# Patient Record
Sex: Female | Born: 1935 | Race: White | Hispanic: No | Marital: Married | Smoking: Former smoker
Health system: Southern US, Community
[De-identification: ages and names within clinical notes are randomized; demographics above are authoritative.]

## PROBLEM LIST (undated history)

## (undated) DIAGNOSIS — K5900 Constipation, unspecified: Secondary | ICD-10-CM

## (undated) DIAGNOSIS — N390 Urinary tract infection, site not specified: Secondary | ICD-10-CM

## (undated) DIAGNOSIS — M1990 Unspecified osteoarthritis, unspecified site: Secondary | ICD-10-CM

## (undated) DIAGNOSIS — F0390 Unspecified dementia without behavioral disturbance: Secondary | ICD-10-CM

## (undated) DIAGNOSIS — I10 Essential (primary) hypertension: Secondary | ICD-10-CM

## (undated) DIAGNOSIS — M4800 Spinal stenosis, site unspecified: Secondary | ICD-10-CM

## (undated) DIAGNOSIS — F419 Anxiety disorder, unspecified: Secondary | ICD-10-CM

## (undated) DIAGNOSIS — F32A Depression, unspecified: Secondary | ICD-10-CM

## (undated) DIAGNOSIS — F329 Major depressive disorder, single episode, unspecified: Secondary | ICD-10-CM

## (undated) DIAGNOSIS — R011 Cardiac murmur, unspecified: Secondary | ICD-10-CM

---

## 2016-08-17 ENCOUNTER — Emergency Department (HOSPITAL_COMMUNITY): Payer: Medicare Other

## 2016-08-17 ENCOUNTER — Encounter (HOSPITAL_COMMUNITY): Payer: Self-pay | Admitting: *Deleted

## 2016-08-17 ENCOUNTER — Observation Stay (HOSPITAL_COMMUNITY)
Admission: EM | Admit: 2016-08-17 | Discharge: 2016-08-18 | Disposition: A | Discharge status: Home / Self Care | Payer: Medicare Other | Attending: Internal Medicine | Admitting: Internal Medicine

## 2016-08-17 DIAGNOSIS — R4182 Altered mental status, unspecified: Secondary | ICD-10-CM | POA: Diagnosis present

## 2016-08-17 DIAGNOSIS — R011 Cardiac murmur, unspecified: Secondary | ICD-10-CM | POA: Diagnosis not present

## 2016-08-17 DIAGNOSIS — Z5181 Encounter for therapeutic drug level monitoring: Secondary | ICD-10-CM | POA: Insufficient documentation

## 2016-08-17 DIAGNOSIS — M6281 Muscle weakness (generalized): Secondary | ICD-10-CM | POA: Diagnosis not present

## 2016-08-17 DIAGNOSIS — Z79899 Other long term (current) drug therapy: Secondary | ICD-10-CM | POA: Insufficient documentation

## 2016-08-17 DIAGNOSIS — R2689 Other abnormalities of gait and mobility: Secondary | ICD-10-CM | POA: Diagnosis not present

## 2016-08-17 DIAGNOSIS — F0390 Unspecified dementia without behavioral disturbance: Secondary | ICD-10-CM | POA: Insufficient documentation

## 2016-08-17 DIAGNOSIS — R404 Transient alteration of awareness: Principal | ICD-10-CM | POA: Insufficient documentation

## 2016-08-17 DIAGNOSIS — Z9181 History of falling: Secondary | ICD-10-CM | POA: Diagnosis not present

## 2016-08-17 DIAGNOSIS — G459 Transient cerebral ischemic attack, unspecified: Secondary | ICD-10-CM | POA: Diagnosis present

## 2016-08-17 HISTORY — DX: Anxiety disorder, unspecified: F41.9

## 2016-08-17 HISTORY — DX: Unspecified osteoarthritis, unspecified site: M19.90

## 2016-08-17 HISTORY — DX: Cardiac murmur, unspecified: R01.1

## 2016-08-17 HISTORY — DX: Constipation, unspecified: K59.00

## 2016-08-17 HISTORY — DX: Depression, unspecified: F32.A

## 2016-08-17 HISTORY — DX: Urinary tract infection, site not specified: N39.0

## 2016-08-17 HISTORY — DX: Major depressive disorder, single episode, unspecified: F32.9

## 2016-08-17 HISTORY — DX: Essential (primary) hypertension: I10

## 2016-08-17 HISTORY — DX: Spinal stenosis, site unspecified: M48.00

## 2016-08-17 HISTORY — DX: Unspecified dementia, unspecified severity, without behavioral disturbance, psychotic disturbance, mood disturbance, and anxiety: F03.90

## 2016-08-17 LAB — CBC WITH DIFFERENTIAL/PLATELET
BASOS ABS: 0.1 10*3/uL (ref 0.0–0.1)
Basophils Relative: 1 %
EOS ABS: 0.1 10*3/uL (ref 0.0–0.7)
EOS PCT: 2 %
HCT: 36 % (ref 36.0–46.0)
Hemoglobin: 11.6 g/dL — ABNORMAL LOW (ref 12.0–15.0)
Lymphocytes Relative: 27 %
Lymphs Abs: 1.6 10*3/uL (ref 0.7–4.0)
MCH: 30.9 pg (ref 26.0–34.0)
MCHC: 32.2 g/dL (ref 30.0–36.0)
MCV: 96 fL (ref 78.0–100.0)
Monocytes Absolute: 0.6 10*3/uL (ref 0.1–1.0)
Monocytes Relative: 9 %
NEUTROS PCT: 61 %
Neutro Abs: 3.8 10*3/uL (ref 1.7–7.7)
PLATELETS: 163 10*3/uL (ref 150–400)
RBC: 3.75 MIL/uL — AB (ref 3.87–5.11)
RDW: 14.2 % (ref 11.5–15.5)
WBC: 6.2 10*3/uL (ref 4.0–10.5)

## 2016-08-17 LAB — PROTIME-INR
INR: 1.25
PROTHROMBIN TIME: 15.8 s — AB (ref 11.4–15.2)

## 2016-08-17 LAB — COMPREHENSIVE METABOLIC PANEL
ALT: 12 U/L — ABNORMAL LOW (ref 14–54)
AST: 22 U/L (ref 15–41)
Albumin: 3.7 g/dL (ref 3.5–5.0)
Alkaline Phosphatase: 62 U/L (ref 38–126)
Anion gap: 7 (ref 5–15)
BUN: 18 mg/dL (ref 6–20)
CO2: 32 mmol/L (ref 22–32)
CREATININE: 1.06 mg/dL — AB (ref 0.44–1.00)
Calcium: 9.3 mg/dL (ref 8.9–10.3)
Chloride: 102 mmol/L (ref 101–111)
GFR, EST AFRICAN AMERICAN: 54 mL/min — AB (ref >60)
GFR, EST NON AFRICAN AMERICAN: 47 mL/min — AB (ref >60)
Glucose, Bld: 96 mg/dL (ref 65–99)
Potassium: 4.1 mmol/L (ref 3.5–5.1)
Sodium: 141 mmol/L (ref 135–145)
TOTAL PROTEIN: 6.5 g/dL (ref 6.5–8.1)
Total Bilirubin: 0.5 mg/dL (ref 0.3–1.2)

## 2016-08-17 LAB — RAPID URINE DRUG SCREEN, HOSP PERFORMED
Amphetamines: NOT DETECTED
Barbiturates: NOT DETECTED
Benzodiazepines: POSITIVE — AB
Cocaine: NOT DETECTED
Opiates: NOT DETECTED
Tetrahydrocannabinol: NOT DETECTED

## 2016-08-17 LAB — URINALYSIS, ROUTINE W REFLEX MICROSCOPIC
BILIRUBIN URINE: NEGATIVE
Glucose, UA: NEGATIVE mg/dL
Hgb urine dipstick: NEGATIVE
Ketones, ur: NEGATIVE mg/dL
LEUKOCYTES UA: NEGATIVE
NITRITE: NEGATIVE
Protein, ur: NEGATIVE mg/dL
SPECIFIC GRAVITY, URINE: 1.013 (ref 1.005–1.030)
pH: 6 (ref 5.0–8.0)

## 2016-08-17 LAB — APTT: aPTT: 31 seconds (ref 24–36)

## 2016-08-17 LAB — TROPONIN I

## 2016-08-17 LAB — ETHANOL

## 2016-08-17 MED ORDER — SODIUM CHLORIDE 0.9 % IV BOLUS (SEPSIS)
500.0000 mL | Freq: Once | INTRAVENOUS | Status: AC
  Administered 2016-08-17: 500 mL via INTRAVENOUS

## 2016-08-17 MED ORDER — SODIUM CHLORIDE 0.9 % IV SOLN
100.0000 mL/h | INTRAVENOUS | Status: DC
  Administered 2016-08-17: 100 mL/h via INTRAVENOUS

## 2016-08-17 NOTE — ED Triage Notes (Signed)
Confusion onset 1430 yesterday

## 2016-08-17 NOTE — ED Notes (Signed)
MD at bedside. EKG given to Dr. Jeraldine LootsLockwood.  Pt family states she has been having confusion since yesterday and bilateral leg swelling that was significant yesterday.

## 2016-08-17 NOTE — ED Notes (Signed)
Pt has bilateral 1+ pitting edema to bilateral lower extremities, pulses and sensation intact.

## 2016-08-17 NOTE — ED Notes (Signed)
Pt placed on bedpan

## 2016-08-17 NOTE — ED Provider Notes (Signed)
[facility]Y DEPT Provider Note   CSN: [id number] Arrival date & time: 08/17/16  1609     History   Chief Complaint Chief Complaint  Patient presents with  . Altered Mental Status    HPI Jessica Medina is a 83 y.o. female.  HPI Patient presents with family members to assist with the history of present illness. Patient notes patient has a history of arthritis, and dementia, but states that she is generally awake and alert, with only intermittent episodes of forgetfulness, confusion. Now, since yesterday the patient has had persistent word finding difficulty, diminished recognition of family members, and disorientation. Family states the patient also had substantial bilateral lower extremity swelling yesterday, though this has resolved The patient herself struggles to find words such as ankles, but seemingly denies new pain, new confusion, and answers questions when asked, though with some delay, and occasionally with lapses in accuracy. LEVEL V CAVEAT 2/2 CONFUSION  Past Medical History:  Diagnosis Date  . Arthritis     There are no active problems to display for this patient.   No past surgical history on file.  OB History    No data available       Home Medications    Prior to Admission medications   Not on File    Family History No family history on file.  Social History Social History  Substance Use Topics  . Smoking status: Never Smoker  . Smokeless tobacco: Not on file  . Alcohol use No     Allergies   Aspirin   Review of Systems Review of Systems  Unable to perform ROS: Dementia     Physical Exam Updated Vital Signs BP (!) 145/59 (BP Location: Left Arm)   Pulse 62   Temp 98.4 F (36.9 C) (Oral)   Resp 16   Ht 5\' 3"  (1.6 m)   Wt 49.9 kg (110 lb)   SpO2 97%   BMI 19.49 kg/m   Physical Exam  Constitutional: She appears well-developed and well-nourished. No distress.  HENT:  Head: Normocephalic and atraumatic.  Eyes:  Conjunctivae and EOM are normal.  Cardiovascular: Normal rate and regular rhythm.   Murmur heard. Pulmonary/Chest: Effort normal and breath sounds normal. No stridor. No respiratory distress.  Abdominal: She exhibits no distension.  Musculoskeletal: She exhibits no edema.  Neurological: She is alert. She displays atrophy. She displays no tremor. No cranial nerve deficit or sensory deficit. She exhibits normal muscle tone. She displays no seizure activity. Coordination normal.  Skin: Skin is warm and dry.  Psychiatric: Her speech is delayed and tangential. Cognition and memory are impaired.  Confused  Nursing note and vitals reviewed.    ED Treatments / Results  Labs (all labs ordered are listed, but only abnormal results are displayed) Labs Reviewed  COMPREHENSIVE METABOLIC PANEL - Abnormal; Notable for the following:       Result Value   Creatinine, Ser 1.06 (*)    ALT 12 (*)    GFR calc non Af Amer 47 (*)    GFR calc Af Amer 54 (*)    All other components within normal limits  CBC WITH DIFFERENTIAL/PLATELET - Abnormal; Notable for the following:    RBC 3.75 (*)    Hemoglobin 11.6 (*)    All other components within normal limits  PROTIME-INR - Abnormal; Notable for the following:    Prothrombin Time 15.8 (*)    All other components within normal limits  RAPID URINE DRUG SCREEN, HOSP PERFORMED - Abnormal; Notable  for the following:    Benzodiazepines POSITIVE (*)    All other components within normal limits  APTT  ETHANOL  TROPONIN I  URINALYSIS, ROUTINE W REFLEX MICROSCOPIC    EKG  EKG Interpretation  Date/Time:  Thursday Aug 17 2016 16:31:35 EDT Ventricular Rate:  64 PR Interval:    QRS Duration: 94 QT Interval:  402 QTC Calculation: 415 R Axis:   46 Text Interpretation:  Sinus rhythm low voltage p waves Borderline ECG Confirmed by Gerhard MunchLockwood, Gilma Bessette [phone]) on 08/17/2016 5:04:11 PM       Radiology Ct Head Wo Contrast  Result Date: 08/17/2016 CLINICAL DATA:   Altered mental status since yesterday. EXAM: CT HEAD WITHOUT CONTRAST TECHNIQUE: Contiguous axial images were obtained from the base of the skull through the vertex without intravenous contrast. COMPARISON:  None. FINDINGS: Brain: Diffusely enlarged ventricles and subarachnoid spaces. Patchy white matter low density in both cerebral hemispheres. No intracranial hemorrhage, mass lesion or CT evidence of acute infarction. Vascular: No hyperdense vessel or unexpected calcification. Skull: Normal. Negative for fracture or focal lesion. Sinuses/Orbits: Unremarkable. Other: None. IMPRESSION: 1. No acute abnormality. 2. Atrophy and chronic small vessel white matter ischemic changes. Electronically Signed   By: Beckie SaltsSteven  Reid M.D.   On: 08/17/2016 18:35    Procedures Procedures (including critical care time)  Medications Ordered in ED Medications  sodium chloride 0.9 % bolus 500 mL (500 mLs Intravenous New Bag/Given 08/17/16 1652)    Followed by  0.9 %  sodium chloride infusion (not administered)     Initial Impression / Assessment and Plan / ED Course  I have reviewed the triage vital signs and the nursing notes.  Pertinent labs & imaging results that were available during my care of the patient were reviewed by me and considered in my medical decision making (see chart for details).  8:30 PM Patient back to baseline according to family members. Together we discussed all findings including reassuring CT scan, my suspicion for likelihood of worsening cognitive function, but with some concern for TIA. Another consideration is iatrogenic, polypharmacy, and the patient does take benzodiazepine, and uncontrolled her to confusion in the elderly. With the patient's return to baseline, reassuring results, no indication for additional emergent imaging, but the patient will be admitted for further evaluation and management.  Final Clinical Impressions(s) / ED Diagnoses  Altered mental status   Gerhard MunchLockwood,  Izyan Ezzell, MD 08/17/16 2031

## 2016-08-17 NOTE — ED Notes (Signed)
Bedpan removed from pt, skin cleaned and dried.

## 2016-08-18 ENCOUNTER — Observation Stay (HOSPITAL_COMMUNITY): Payer: Medicare Other

## 2016-08-18 ENCOUNTER — Observation Stay (HOSPITAL_BASED_OUTPATIENT_CLINIC_OR_DEPARTMENT_OTHER): Payer: Medicare Other

## 2016-08-18 DIAGNOSIS — R404 Transient alteration of awareness: Secondary | ICD-10-CM | POA: Diagnosis not present

## 2016-08-18 DIAGNOSIS — F0390 Unspecified dementia without behavioral disturbance: Secondary | ICD-10-CM | POA: Diagnosis not present

## 2016-08-18 DIAGNOSIS — G459 Transient cerebral ischemic attack, unspecified: Secondary | ICD-10-CM

## 2016-08-18 DIAGNOSIS — I361 Nonrheumatic tricuspid (valve) insufficiency: Secondary | ICD-10-CM | POA: Diagnosis not present

## 2016-08-18 LAB — ECHOCARDIOGRAM COMPLETE
AO mean calculated velocity dopler: 389 cm/s
AOPV: 0.27 m/s
AOVTI: 141 cm
AV Area VTI: 0.6 cm2
AV Area mean vel: 0.53 cm2
AV area mean vel ind: 0.35 cm2/m2
AV vel: 0.44
AVA: 0.44 cm2
AVAREAVTIIND: 0.29 cm2/m2
AVCELMEANRAT: 0.23
AVG: 70 mmHg
AVLVOTPG: 9 mmHg
AVPG: 123 mmHg
AVPKVEL: 554 cm/s
CHL CUP AV PEAK INDEX: 0.4
CHL CUP AV VALUE AREA INDEX: 0.29
CHL CUP MV DEC (S): 208
CHL CUP RV SYS PRESS: 43 mmHg
EERAT: 24.29
EWDT: 208 ms
FS: 30 % (ref 28–44)
Height: 63 in
IV/PV OW: 0.97
LA diam end sys: 34 mm
LA vol: 84 mL
LADIAMINDEX: 2.24 cm/m2
LASIZE: 34 mm
LAVOLA4C: 109 mL
LAVOLIN: 55.4 mL/m2
LDCA: 2.27 cm2
LV E/e' medial: 24.29
LV TDI E'MEDIAL: 7.07
LVDIAVOL: 72 mL (ref 46–106)
LVDIAVOLIN: 48 mL/m2
LVEEAVG: 24.29
LVELAT: 8.81 cm/s
LVOT VTI: 27.5 cm
LVOT diameter: 17 mm
LVOT peak VTI: 0.2 cm
LVOT peak vel: 147 cm/s
LVOTSV: 62 mL
Lateral S' vel: 17.4 cm/s
MV pk A vel: 126 m/s
MVPG: 18 mmHg
MVPKEVEL: 214 m/s
PW: 11.6 mm — AB (ref 0.6–1.1)
RV TAPSE: 26.5 mm
Reg peak vel: 316 cm/s
TDI e' lateral: 8.81
TR max vel: 316 cm/s
VTI: 207 cm
WEIGHTICAEL: 1830.7 [oz_av]

## 2016-08-18 LAB — LIPID PANEL
CHOL/HDL RATIO: 4.1 ratio
CHOLESTEROL: 181 mg/dL (ref 0–200)
HDL: 44 mg/dL (ref >40)
LDL Cholesterol: 120 mg/dL — ABNORMAL HIGH (ref 0–99)
Triglycerides: 86 mg/dL (ref <150)
VLDL: 17 mg/dL (ref 0–40)

## 2016-08-18 MED ORDER — LORAZEPAM 0.5 MG PO TABS
0.5000 mg | ORAL_TABLET | Freq: Two times a day (BID) | ORAL | Status: DC
  Administered 2016-08-18: 0.5 mg via ORAL
  Filled 2016-08-18: qty 1

## 2016-08-18 MED ORDER — BUSPIRONE HCL 5 MG PO TABS
30.0000 mg | ORAL_TABLET | Freq: Two times a day (BID) | ORAL | Status: DC
  Administered 2016-08-18: 30 mg via ORAL
  Filled 2016-08-18: qty 6

## 2016-08-18 MED ORDER — TRAMADOL HCL 50 MG PO TABS: 50.0000 mg | ORAL_TABLET | Freq: Four times a day (QID) | ORAL | Status: DC | PRN

## 2016-08-18 MED ORDER — PANTOPRAZOLE SODIUM 40 MG PO TBEC
40.0000 mg | DELAYED_RELEASE_TABLET | Freq: Every morning | ORAL | Status: DC
  Administered 2016-08-18: 40 mg via ORAL
  Filled 2016-08-18: qty 1

## 2016-08-18 MED ORDER — SODIUM CHLORIDE 0.9 % IV SOLN
INTRAVENOUS | Status: DC
  Administered 2016-08-18: 03:00:00 via INTRAVENOUS

## 2016-08-18 MED ORDER — FOLIC ACID 1 MG PO TABS
1.0000 mg | ORAL_TABLET | Freq: Two times a day (BID) | ORAL | Status: DC
  Administered 2016-08-18: 1 mg via ORAL
  Filled 2016-08-18: qty 1

## 2016-08-18 MED ORDER — MAGNESIUM OXIDE 400 (241.3 MG) MG PO TABS
400.0000 mg | ORAL_TABLET | Freq: Every morning | ORAL | Status: DC
  Administered 2016-08-18: 400 mg via ORAL
  Filled 2016-08-18 (×4): qty 1

## 2016-08-18 MED ORDER — SENNOSIDES-DOCUSATE SODIUM 8.6-50 MG PO TABS: 1.0000 | ORAL_TABLET | Freq: Every evening | ORAL | Status: DC | PRN

## 2016-08-18 MED ORDER — DULOXETINE HCL 30 MG PO CPEP
30.0000 mg | ORAL_CAPSULE | Freq: Every morning | ORAL | Status: DC
  Administered 2016-08-18: 30 mg via ORAL
  Filled 2016-08-18: qty 1

## 2016-08-18 MED ORDER — MECLIZINE HCL 12.5 MG PO TABS
25.0000 mg | ORAL_TABLET | Freq: Every morning | ORAL | Status: DC
  Administered 2016-08-18: 25 mg via ORAL
  Filled 2016-08-18: qty 2

## 2016-08-18 MED ORDER — SENNA 8.6 MG PO TABS
2.0000 | ORAL_TABLET | Freq: Every evening | ORAL | Status: DC
Start: 2016-08-18 — End: 2016-08-18
  Administered 2016-08-18: 17.2 mg via ORAL
  Filled 2016-08-18: qty 2

## 2016-08-18 MED ORDER — ENSURE ENLIVE PO LIQD: 237.0000 mL | Freq: Two times a day (BID) | ORAL | Status: DC

## 2016-08-18 MED ORDER — STROKE: EARLY STAGES OF RECOVERY BOOK
Freq: Once | Status: DC
  Filled 2016-08-18: qty 1

## 2016-08-18 MED ORDER — ENOXAPARIN SODIUM 40 MG/0.4ML ~~LOC~~ SOLN
40.0000 mg | SUBCUTANEOUS | Status: DC
  Filled 2016-08-18: qty 0.4

## 2016-08-18 MED ORDER — MELATONIN 3 MG PO TABS: 3.0000 mg | ORAL_TABLET | Freq: Every day | ORAL | Status: DC

## 2016-08-18 NOTE — Evaluation (Addendum)
Physical Therapy Evaluation Patient Details Name: Jessica Medina MRN: [id number] DOB: [birth year] Today's Date: 08/18/2016   History of Present Illness  Jessica Medina is an 83yo white female who comes to [facility] after worsening AMS, anomia, family recognition, and disorientation. Pt has baseline demetia and requires total care at baseline for ADL/IADL. She is still fairly mobile in home with RW, but has only been outside of home 1x in 2 years, and has frequent falls.   Clinical Impression  Pt admitted with above diagnosis. Pt currently with functional limitations due to the deficits listed below (see "PT Problem List"). Pt performing all functional mobility demonstrative of impaired strength, balance, and activity tolerance, near but slightly more impaired than her baseline. Cognitive deficits create high distractibility, tangential thought process/speech, and frequent redirection/moivation. Pt typically able to AMB room to room at home, but today has limited confidence to AMB any meaningful distances at all with elevated falls anxiety, which alone is highly predictive of future falls. Pt will benefit from skilled PT intervention to increase independence and safety with basic mobility in preparation for discharge to the venue listed below.       Follow Up Recommendations Home health PT;Supervision/Assistance - 24 hour;Other (comment) (would benefit from transition to memory care)    Equipment Recommendations  None recommended by PT    Recommendations for Other Services       Precautions / Restrictions Precautions Precautions: Fall Restrictions Weight Bearing Restrictions: No      Mobility  Bed Mobility Overal bed mobility: Needs Assistance Bed Mobility: Supine to Sit;Sit to Supine     Supine to sit: Mod assist Sit to supine: Mod assist   General bed mobility comments: Not tested-pt cleaned and external cathetor placed immediately prior to OT arrival  Transfers Overall transfer  level: Needs assistance Equipment used: Rolling walker (2 wheeled) Transfers: Sit to/from Stand Sit to Stand: From elevated surface;Supervision         General transfer comment: Rt knee chronic genuvalgum and loss of TKE at baseline.   Ambulation/Gait Ambulation/Gait assistance: Min guard Ambulation Distance (Feet): 1 Feet Assistive device: Rolling walker (2 wheeled)     Gait velocity interpretation: <1.8 ft/sec, indicative of risk for recurrent falls General Gait Details: poor confidence in legs, takes a few steps forward then backward when cued.   Stairs            Wheelchair Mobility    Modified Rankin (Stroke Patients Only)       Balance Overall balance assessment: History of Falls;Needs assistance                                           Pertinent Vitals/Pain Pain Assessment: No/denies pain    Home Living Family/patient expects to be discharged to:: Private residence Living Arrangements: Spouse/significant other Available Help at Discharge: Family;Available PRN/intermittently Type of Home: House Home Access: Level entry     Home Layout: One level Home Equipment: Walker - 2 wheels;Walker - 4 wheels;Tub bench;Wheelchair - Geophysical data processor      Prior Function Level of Independence: Needs assistance   Gait / Transfers Assistance Needed: Pt using wheelchair and RW PTA, along with assistance for ambulation  ADL's / Homemaking Assistance Needed: Pt has aide 8 hrs/day who assists with all ADL completion, including getting in and out of bed at morning and night  Comments: Pt's husband provided supervision at night-was  able to fix meds and meals, and call for assistance if there was a problem, was unable to provide physical assistance. [place], expected to be in SNF and will be unable to provide any supervision or care     Hand Dominance   Dominant Hand: Right    Extremity/Trunk Assessment   Upper  Extremity Assessment Upper Extremity Assessment: Generalized weakness;RUE deficits/detail;LUE deficits/detail RUE Deficits / Details: ROM limited to approximately 50% at baseline due to arthritis LUE Deficits / Details: ROM limited to approximately 50% due to arthritis    Lower Extremity Assessment Lower Extremity Assessment: Generalized weakness       Communication   Communication: No difficulties  Cognition Arousal/Alertness:  (somnolent ) Behavior [facility]: [facility] for tasks assessed/performed;Anxious;Agitated (easily distractible from aggitation) Overall Cognitive Status: History of cognitive impairments - at baseline Area of Impairment: Orientation                 Orientation Level: Person       Safety/Judgement: Decreased awareness of safety   Problem Solving: Slow processing;Decreased initiation;Difficulty sequencing;Requires verbal cues General Comments: requires constant redirection and encouragement for participation      General Comments      Exercises     Assessment/Plan    PT Assessment Patient needs continued PT services  PT Problem List Decreased strength;Decreased activity tolerance;Decreased balance;Decreased mobility       PT Treatment Interventions Functional mobility training;Therapeutic activities;Gait training;Therapeutic exercise;Balance training;Patient/family education;Cognitive remediation;Neuromuscular re-education    PT Goals (Current goals can be found in the Care Plan section)       Frequency Min 2X/week   Barriers to discharge Decreased caregiver support      Co-evaluation               AM-PAC PT "6 Clicks" Daily Activity  Outcome Measure Difficulty turning over in bed (including adjusting bedclothes, sheets and blankets)?: Total Difficulty moving from lying on back to sitting on the side of the bed? : Total Difficulty sitting down on and standing up from a chair with arms (e.g., wheelchair, bedside commode,  etc,.)?: A Lot Help needed moving to and from a bed to chair (including a wheelchair)?: Total Help needed walking in hospital room?: A Lot Help needed climbing 3-5 steps with a railing? : Total 6 Click Score: 8    End of Session Equipment Utilized During Treatment: Gait belt Activity Tolerance: Patient tolerated treatment well;No increased pain;Patient limited by fatigue Patient left: in bed;with call bell/phone within reach;with family/visitor present;with bed alarm set Nurse Communication: Mobility status PT Visit Diagnosis: Muscle weakness (generalized) (M62.81);Other abnormalities of gait and mobility (R26.89);History of falling (Z91.81)    Time: [id number] PT Time Calculation (min) (ACUTE ONLY): 29 min   Charges:   PT Evaluation $PT Eval Low Complexity: 1 Procedure PT Treatments $Therapeutic Activity: 8-22 mins   PT G Codes:   PT G-Codes **NOT FOR INPATIENT CLASS** Functional Assessment Tool Used: AM-PAC 6 Clicks Basic Mobility Functional Limitation: Changing and maintaining body position Changing and Maintaining Body Position Current Status (V4098(G8981): At least 80 percent but less than 100 percent impaired, limited or restricted Changing and Maintaining Body Position Goal Status (J1914(G8982): At least 80 percent but less than 100 percent impaired, limited or restricted    12:23 PM, 08/18/16 Rosamaria LintsAllan C Eloyce Bultman, PT, DPT Physical Therapist - [place] [phone](ASCOM)  [phone] (Office)   Prabhjot Maddux C 08/18/2016, 12:20 PM  *addendum on 09/08/16 to add the following information based  on objective data from the original date of treatment:   Physical Therapy Diagnosis Codes for this Visit: Other Abnormalities of Gait and Mobility (R26.89) History of Falling (Z91.81) Muscle Weakness (Generalized) (M62.81)   7:23 AM, 09/08/16 Rosamaria Lints, PT, DPT Physical Therapist - [place] [phone] (Office)

## 2016-08-18 NOTE — Care Management Obs Status (Signed)
MEDICARE OBSERVATION STATUS NOTIFICATION   Patient Details  Name: Jessica Medina MRN: [id number] Date of Birth: 1929/04/23   Medicare Observation Status Notification Given:  Yes    Sharanya Templin, Chrystine OilerSharley Diane, RN 08/18/2016, 2:12 PM

## 2016-08-18 NOTE — H&P (Signed)
[facility] H&P    Patient Demographics:    Jessica Medina, is a 83 y.o. female  MRN: [id number]  DOB - 1929/06/15  Admit Date - 08/17/2016  Referring MD/NP/PA: Dr Jeraldine Loots  Outpatient Primary MD for the patient is System, Pcp Not In  Patient coming from: Home  Chief Complaint  Patient presents with  . Altered Mental Status      HPI:    Jessica Medina  is a 83 y.o. female, With history of dementia, arthritis was brought to hospital with intermittent episodes of confusion. Patient does have dementia, but as per family she is having persistent word finding difficulty, diminished recognition of family members and disorientation. There is no history of slurred speech, no blurred vision. No weakness of extremities. She thinks she is in college at this time, not oriented to time, only oriented to herself. She denies any pain at this time.  In the ED, CT scan head showed no acute abnormality.  There is no history of fever, no nausea vomiting or diarrhea. No chest pain or shortness of breath.   Review of systems:      A full 10 point Review of Systems was done, except as stated above, all other Review of Systems were negative.   With Past History of the following :    Past Medical History:  Diagnosis Date  . Anxiety   . Arthritis   . Constipation   . Dementia   . Depression   . Heart murmur   . Hypertension   . Spinal stenosis   . Urinary tract infection       History reviewed. No pertinent surgical history.    Social History:      Social History  Substance Use Topics  . Smoking status: Former Smoker    Quit date: 08/17/1973  . Smokeless tobacco: Not on file  . Alcohol use No       Family History :     Family History  Problem Relation Age of Onset  . Lung cancer Mother   . Stroke Father   . Heart attack Sister       Home Medications:   Prior to Admission medications     Medication Sig Start Date End Date Taking? Authorizing Provider  busPIRone (BUSPAR) 15 MG tablet Take 30 mg by mouth 2 (two) times daily.    Yes [provider]  DULoxetine (CYMBALTA) 30 MG capsule Take 30 mg by mouth every morning.   Yes [provider]  folic acid (FOLVITE) 1 MG tablet Take 1 mg by mouth 2 (two) times daily.   Yes [provider]  LORazepam (ATIVAN) 0.5 MG tablet Take 0.5 mg by mouth 2 (two) times daily.   Yes [provider]  magnesium oxide (MAG-OX) 400 MG tablet Take 400 mg by mouth every morning.   Yes [provider]  meclizine (ANTIVERT) 25 MG tablet Take 25 mg by mouth every morning.   Yes [provider]  Melatonin 3 MG TABS Take 3 mg by mouth at bedtime.  Yes [provider]  pantoprazole (PROTONIX) 40 MG tablet Take 40 mg by mouth every morning.   Yes [provider]  senna (SENNA LAX) 8.6 MG tablet Take 2 tablets by mouth every evening.    Yes [provider]  tiZANidine (ZANAFLEX) 4 MG tablet Take 4 mg by mouth 3 (three) times daily.   Yes [provider]  traMADol (ULTRAM) 50 MG tablet Take 100 mg by mouth 4 (four) times daily.    Yes [provider]     Allergies:     Allergies  Allergen Reactions  . Aspirin   . Codeine   . Lovastatin Other (See Comments)     Physical Exam:   Vitals  Blood pressure (!) 181/73, pulse 76, temperature 98 F (36.7 C), temperature source Oral, resp. rate 17, height 5\' 3"  (1.6 m), weight 51.9 kg (114 lb 6.7 oz), SpO2 98 %.  1.  General: Appears in no acute distress  2. Psychiatric:  Intact judgement and  insight, awake alert, oriented to self only  3. Neurologic: No focal neurological deficits, all cranial nerves intact.Strength 5/5 all 4 extremities, sensation intact all 4 extremities, plantars down going.  4. Eyes :  anicteric sclerae, moist conjunctivae with no lid lag. PERRLA.  5. ENMT:  Oropharynx clear with  moist mucous membranes and good dentition  6. Neck:  supple, no cervical lymphadenopathy appriciated, No thyromegaly  7. Respiratory : Normal respiratory effort, good air movement bilaterally,clear to  auscultation bilaterally  8. Cardiovascular : RRR, no gallops, rubs or murmurs, no leg edema  9. Gastrointestinal:  Positive bowel sounds, abdomen soft, non-tender to palpation,no hepatosplenomegaly, no rigidity or guarding       10. Skin:  No cyanosis, normal texture and turgor, no rash, lesions or ulcers  11.Musculoskeletal:  Good muscle tone,  joints appear normal , no effusions,  normal range of motion    Data Review:    CBC  Recent Labs Lab 08/17/16 1637  WBC 6.2  HGB 11.6*  HCT 36.0  PLT 163  MCV 96.0  MCH 30.9  MCHC 32.2  RDW 14.2  LYMPHSABS 1.6  MONOABS 0.6  EOSABS 0.1  BASOSABS 0.1   ------------------------------------------------------------------------------------------------------------------  Chemistries   Recent Labs Lab 08/17/16 1637  NA 141  K 4.1  CL 102  CO2 32  GLUCOSE 96  BUN 18  CREATININE 1.06*  CALCIUM 9.3  AST 22  ALT 12*  ALKPHOS 62  BILITOT 0.5   ------------------------------------------------------------------------------------------------------------------  ------------------------------------------------------------------------------------------------------------------ GFR: Estimated Creatinine Clearance: 32.4 mL/min (A) (by C-G formula based on SCr of 1.06 mg/dL (H)). Liver Function Tests:  Recent Labs Lab 08/17/16 1637  AST 22  ALT 12*  ALKPHOS 62  BILITOT 0.5  PROT 6.5  ALBUMIN 3.7   No results for input(s): LIPASE, AMYLASE in the last 168 hours. No results for input(s): AMMONIA in the last 168 hours. Coagulation Profile:  Recent Labs Lab 08/17/16 1637  INR 1.25   Cardiac Enzymes:  Recent Labs Lab 08/17/16 1637  TROPONINI <0.03     --------------------------------------------------------------------------------------------------------------- Urine analysis:    Component Value Date/Time   COLORURINE YELLOW 08/17/2016 1854   APPEARANCEUR CLEAR 08/17/2016 1854   LABSPEC 1.013 08/17/2016 1854   PHURINE 6.0 08/17/2016 1854   GLUCOSEU NEGATIVE 08/17/2016 1854   HGBUR NEGATIVE 08/17/2016 1854   BILIRUBINUR NEGATIVE 08/17/2016 1854   KETONESUR NEGATIVE 08/17/2016 1854   PROTEINUR NEGATIVE 08/17/2016 1854   NITRITE NEGATIVE 08/17/2016 1854   LEUKOCYTESUR NEGATIVE 08/17/2016 1854  Imaging Results:    Ct Head Wo Contrast  Result Date: 08/17/2016 CLINICAL DATA:  Altered mental status since yesterday. EXAM: CT HEAD WITHOUT CONTRAST TECHNIQUE: Contiguous axial images were obtained from the base of the skull through the vertex without intravenous contrast. COMPARISON:  None. FINDINGS: Brain: Diffusely enlarged ventricles and subarachnoid spaces. Patchy white matter low density in both cerebral hemispheres. No intracranial hemorrhage, mass lesion or CT evidence of acute infarction. Vascular: No hyperdense vessel or unexpected calcification. Skull: Normal. Negative for fracture or focal lesion. Sinuses/Orbits: Unremarkable. Other: None. IMPRESSION: 1. No acute abnormality. 2. Atrophy and chronic small vessel white matter ischemic changes. Electronically Signed   By: Beckie SaltsSteven  Reid M.D.   On: 08/17/2016 18:35    My personal review of EKG: Rhythm NSR   Assessment & Plan:    Active Problems:   TIA (transient ischemic attack)   Dementia   1. TIA- place under observation, initiate TIA protocol. Will check MRA/MRA brain, carotid Dopplers, echocardiogram in a.m. Check hemoglobin A1c, lipid profile. 2. Polypharmacy-patient is on multiple psychotropic meds and pain meds. Will cut down the dose of tramadol 50 mg every 6 hours when necessary. Will hold Zanaflex. Continue Ativan 0.5 mg by mouth twice a day 3. Dementia- patient  is not on any medications. No behavior disturbance at this time. Will monitor   DVT Prophylaxis-   Lovenox   AM Labs Ordered, also please review Full Orders  Family Communication: Admission, patients condition and plan of care including tests being ordered have been discussed with the patient, her son and daughter-in-law at bedside who indicate understanding and agree with the plan and Code Status.  Code Status:  DO NOT RESUSCITATE  Admission status: Observation    Time spent in minutes : 50 minutes   Knolan Simien S M.D on 08/18/2016 at 12:28 AM  Between 7am to 7pm - Pager - [phone]. After 7pm go to [email or web address] - password [email or web address]  [facility] - Office  [phone]

## 2016-08-18 NOTE — Progress Notes (Signed)
Order received for bedside swallow evaluation and cognitive linguistic eval. Plan to complete late afternoon.  Thana AtesAmy Leston Schueller, MA, CCC-SLP

## 2016-08-18 NOTE — Evaluation (Signed)
Occupational Therapy Evaluation Patient Details Name: Jessica Medina MRN: [id number] DOB: 1929/11/29 Today's Date: 08/18/2016    History of Present Illness Jessica Medina  is a 83 y.o. female, With history of dementia, arthritis was brought to hospital with intermittent episodes of confusion. Patient does have dementia, but as per family she is having persistent word finding difficulty, diminished recognition of family members and disorientation. There is no history of slurred speech, no blurred vision. No weakness of extremities. In the ED, CT scan head showed no acute abnormality.   Clinical Impression   Pt received in supine in bed, agreeable to OT evaluation, son and daughter-in-law present and able to assist with PLOF information. Pt has been living with husband, assistance from aide 4 hours in the morning and 4 hours at night, aide assists with all ADL completion and provides assistance for functional mobility with RW or wheelchair. Husband is able to supervise and fix meals/meds, however has not been providing physical assistance. During evaluation pt is oriented to person and place, is able to name family members, however family reports was unable to approximately 1 hour prior to OT arrival. Cognition has been noticeably declining over past few weeks according to family. Pt able to complete tasks with set-up and consistent cuing for sequencing at bed level. At this time, family is unable to provide 24/7 care for pt, and pt's husband is also in hospital (in [place]) and will be unable to supervise pt. Family is interested in LTC placement as they are concerned for pt's safety and aware that pt is not able to be alone for any length of time. Due to pt's cognition limiting carryover and PLOF requiring assistance for all ADL tasks, no further OT services are recommended at this time as pt is at baseline with max-total care. Defer to PT for any further rehab recommendations.     Follow Up  Recommendations  No OT follow up;Other (comment) (LTC placement)    Equipment Recommendations  None recommended by OT       Precautions / Restrictions Precautions Precautions: Fall Restrictions Weight Bearing Restrictions: No      Mobility Bed Mobility               General bed mobility comments: Not tested-pt cleaned and external cathetor placed immediately prior to OT arrival  Transfers                 General transfer comment: Not tested        ADL either performed or assessed with clinical judgement   ADL Overall ADL's : Needs assistance/impaired     Grooming: Oral care;Set up;Cueing for sequencing;Bed level Grooming Details (indicate cue type and reason): Pt swabbed mouth required verbal and visual cuing for completion                     Toileting- Clothing Manipulation and Hygiene: Total assistance;Bed level Toileting - Clothing Manipulation Details (indicate cue type and reason): Pt requiring total assistance for toileting care at bed level, pt with external cathetor       General ADL Comments: Pt requiring max-total assistance for ADL tasks PTA.     Vision Baseline Vision/History: Wears glasses Wears Glasses: At all times Patient Visual Report: No change from baseline Vision Assessment?: No apparent visual deficits Additional Comments: at baseline            Pertinent Vitals/Pain Pain Assessment: No/denies pain     Hand Dominance Right   Extremity/Trunk Assessment  Upper Extremity Assessment Upper Extremity Assessment: Generalized weakness;RUE deficits/detail;LUE deficits/detail RUE Deficits / Details: ROM limited to approximately 50% at baseline due to arthritis LUE Deficits / Details: ROM limited to approximately 50% due to arthritis   Lower Extremity Assessment Lower Extremity Assessment: Defer to PT evaluation       Communication Communication Communication: No difficulties   Cognition Arousal/Alertness:  Awake/alert Behavior During Therapy: WFL for tasks assessed/performed Overall Cognitive Status: Impaired/Different from baseline Area of Impairment: Orientation;Safety/judgement;Awareness;Problem solving                 Orientation Level: Disoriented to;Time;Situation       Safety/Judgement: Decreased awareness of safety   Problem Solving: Slow processing;Decreased initiation;Difficulty sequencing;Requires verbal cues                Home Living   Living Arrangements: Spouse/significant other Available Help at Discharge: Family;Available PRN/intermittently Type of Home: House             Bathroom Shower/Tub: Tub/shower unit   Bathroom Toilet: Handicapped height     Home Equipment: Environmental consultantWalker - 2 wheels;Walker - 4 wheels;Tub bench;Wheelchair - manual          Prior Functioning/Environment Level of Independence: Needs assistance  Gait / Transfers Assistance Needed: Pt using wheelchair and RW PTA, along with assistance for ambulation ADL's / Homemaking Assistance Needed: Pt has aide 8 hrs/day who assists with all ADL completion, including getting in and out of bed at morning and night   Comments: Pt's husband provided supervision at night-was able to fix meds and meals, and call for assistance if there was a problem, was unable to provide physical assistance. [place] and will be unable to provide any supervision or care        OT Problem List: Decreased activity tolerance;Impaired balance (sitting and/or standing);Decreased cognition;Decreased safety awareness;Decreased knowledge of use of DME or AE       AM-PAC PT "6 Clicks" Daily Activity     Outcome Measure Help from another person eating meals?: A Lot Help from another person taking care of personal grooming?: A Lot Help from another person toileting, which includes using toliet, bedpan, or urinal?: Total Help from another person bathing (including  washing, rinsing, drying)?: Total Help from another person to put on and taking off regular upper body clothing?: Total Help from another person to put on and taking off regular lower body clothing?: Total 6 Click Score: 8   End of Session    Activity Tolerance: Patient tolerated treatment well Patient left: in bed;with call bell/phone within reach;with bed alarm set;with family/visitor present  OT Visit Diagnosis: Muscle weakness (generalized) (M62.81);Other symptoms and signs involving cognitive function                Time: [id number] OT Time Calculation (min): 29 min Charges:  OT General Charges $OT Visit: 1 Procedure OT Evaluation $OT Eval Low Complexity: 1 Procedure G-Codes: OT G-codes **NOT FOR INPATIENT CLASS** Functional Assessment Tool Used: AM-PAC 6 Clicks Daily Activity Functional Limitation: Self care Self Care Current Status (I6962(G8987): At least 80 percent but less than 100 percent impaired, limited or restricted Self Care Goal Status ([id number](G8988): At least 80 percent but less than 100 percent impaired, limited or restricted Self Care Discharge Status [phone](G8989): At least 80 percent but less than 100 percent impaired, limited or restricted    Jessica Medina, OTR/L  [phone] 08/18/2016, 11:17 AM

## 2016-08-18 NOTE — Care Management Note (Signed)
Case Management Note  Patient Details  Name: Jessica Medina MRN: [id number] Date of Birth: [birth year]     Expected Discharge Date:  08/18/16               Expected Discharge Plan:  Home w Home Health Services  In-House Referral:  Clinical Social Work  Discharge planning Services  CM Consult  Post Acute Care Choice:  Home Health Choice offered to:  Patient, Adult Children  DME Arranged:    DME Agency:     HH Arranged:  RN, PT, Nurse's Aide, Social Work [facility] [employer]  Status of Service:  Completed, signed off  If discussed at [employer] Meetings, dates discussed:  Patient not eligible for SNF, family to apply for Medicaid. Wants HH services at discharge, wants [facility]. [facility]. Family aware HH services will not likely start til later next week. No other CM needs.   Additional Comments:  Ihor Meinzer, Chrystine OilerSharley Diane, RN 08/18/2016, 4:20 PM

## 2016-08-18 NOTE — Discharge Summary (Signed)
Physician Discharge Summary  Jessica Medina ZOX:[id number] DOB: [birth year] DOA: 08/17/2016  PCP: System, Pcp Not In  Admit date: 08/17/2016 Discharge date: 08/18/2016  Time spent: 45 minutes  Recommendations for Outpatient Follow-up:  -Will be discharged home today. -Unfortunately, she does not qualify for SNF based on her insurance status.  Discharge Diagnoses:  Active Problems:   TIA (transient ischemic attack)   Dementia   Discharge Condition: Stable and improved  Filed Weights   08/17/16 1639 08/17/16 2300  Weight: 49.9 kg (110 lb) 51.9 kg (114 lb 6.7 oz)    History of present illness:  As per Dr. Sharl Ma on 6/1: Jessica Medina  is a 83 y.o. female, With history of dementia, arthritis was brought to hospital with intermittent episodes of confusion. Patient does have dementia, but as per family she is having persistent word finding difficulty, diminished recognition of family members and disorientation. There is no history of slurred speech, no blurred vision. No weakness of extremities. She thinks she is in college at this time, not oriented to time, only oriented to herself. She denies any pain at this time.  In the ED, CT scan head showed no acute abnormality.  There is no history of fever, no nausea vomiting or diarrhea. No chest pain or shortness of breath.  Hospital Course:   TIA -MRI without acute CVA. -Carotid Dopplers: left >70%, right 50-69%. -ECHO: EF 65-70% normal LV diastolic function. -Not on ASA due to allergy. Family does not want her on plavix. -PT recs HHPT with supervision vs SNF. Unfortunately, her insurance situation will not allow for SNF, so HH therapies will be arranged prior to DC.  Dementia -At baseline. -Seems confused, which is not unexpected at present.  Procedures:  As above   Consultations:  None  Discharge Instructions  Discharge Instructions    Diet - low sodium heart healthy    Complete by:  As directed    Increase  activity slowly    Complete by:  As directed      Allergies as of 08/18/2016      Reactions   Aspirin    Codeine    Lovastatin Other (See Comments)      Medication List    TAKE these medications   busPIRone 15 MG tablet Commonly known as:  BUSPAR Take 30 mg by mouth 2 (two) times daily.   DULoxetine 30 MG capsule Commonly known as:  CYMBALTA Take 30 mg by mouth every morning.   folic acid 1 MG tablet Commonly known as:  FOLVITE Take 1 mg by mouth 2 (two) times daily.   LORazepam 0.5 MG tablet Commonly known as:  ATIVAN Take 0.5 mg by mouth 2 (two) times daily.   magnesium oxide 400 MG tablet Commonly known as:  MAG-OX Take 400 mg by mouth every morning.   meclizine 25 MG tablet Commonly known as:  ANTIVERT Take 25 mg by mouth every morning.   Melatonin 3 MG Tabs Take 3 mg by mouth at bedtime.   pantoprazole 40 MG tablet Commonly known as:  PROTONIX Take 40 mg by mouth every morning.   SENNA LAX 8.6 MG tablet Generic drug:  senna Take 2 tablets by mouth every evening.   tiZANidine 4 MG tablet Commonly known as:  ZANAFLEX Take 4 mg by mouth 3 (three) times daily.   traMADol 50 MG tablet Commonly known as:  ULTRAM Take 100 mg by mouth 4 (four) times daily.      Allergies  Allergen Reactions  .  Aspirin   . Codeine   . Lovastatin Other (See Comments)   Follow-up Information    with your regular provider. Schedule an appointment as soon as possible for a visit in 2 week(s).            The results of significant diagnostics from this hospitalization (including imaging, microbiology, ancillary and laboratory) are listed below for reference.    Significant Diagnostic Studies: Dg Chest 2 View  Result Date: 08/18/2016 CLINICAL DATA:  Patient with history of confusion.  TIA. EXAM: CHEST  2 VIEW COMPARISON:  None. FINDINGS: Patient is rotated limiting evaluation. Enlarged cardiac contours. Tortuosity and calcification of the thoracic aorta. Rightward  tracheal bowing. Abnormal contour abnormality within the lower aspect of the mediastinum. Low lung volumes. No large area of pulmonary consolidation. No pleural effusion or pneumothorax. Thoracic spine degenerative changes. Kyphoplasty material IMPRESSION: Limited rotated frontal exam limits evaluation of the mediastinum. There is rightward tracheal deviation which may be secondary to patient rotation. Recommend repeat PA evaluation when patient clinically able for further evaluation. No large area of pulmonary consolidation. Aortic atherosclerosis. Electronically Signed   By: Annia Belt M.D.   On: 08/18/2016 13:03   Ct Head Wo Contrast  Result Date: 08/17/2016 CLINICAL DATA:  Altered mental status since yesterday. EXAM: CT HEAD WITHOUT CONTRAST TECHNIQUE: Contiguous axial images were obtained from the base of the skull through the vertex without intravenous contrast. COMPARISON:  None. FINDINGS: Brain: Diffusely enlarged ventricles and subarachnoid spaces. Patchy white matter low density in both cerebral hemispheres. No intracranial hemorrhage, mass lesion or CT evidence of acute infarction. Vascular: No hyperdense vessel or unexpected calcification. Skull: Normal. Negative for fracture or focal lesion. Sinuses/Orbits: Unremarkable. Other: None. IMPRESSION: 1. No acute abnormality. 2. Atrophy and chronic small vessel white matter ischemic changes. Electronically Signed   By: Beckie Salts M.D.   On: 08/17/2016 18:35   Mr Brain Wo Contrast  Result Date: 08/18/2016 CLINICAL DATA:  Altered mental status.  TIA EXAM: MRI HEAD WITHOUT CONTRAST MRA HEAD WITHOUT CONTRAST TECHNIQUE: Multiplanar, multiecho pulse sequences of the brain and surrounding structures were obtained without intravenous contrast. Angiographic images of the head were obtained using MRA technique without contrast. COMPARISON:  Head CT from yesterday FINDINGS: MRI HEAD FINDINGS Brain: No acute infarction, hemorrhage, hydrocephalus, extra-axial  collection or mass lesion. Confluent areas of superficial siderosis along the right frontal pole, right occipital parietal convexity, and posterior left temporal lobe. No acute hemorrhage noted on previous head CT. Extensive FLAIR hyperintensity in the cerebral white matter, likely microvascular ischemic. Patient has history of dementia. Volume loss is overall mild for age. Vascular: Arterial findings below. Normal dural venous sinus flow voids. Skull and upper cervical spine: Negative for marrow lesion Sinuses/Orbits: Right cataract resection.  No acute finding. MRA HEAD FINDINGS Artifact from intermittent motion degradation. No major branch occlusion noted. Intracranial atherosclerosis with atheromatous type irregularity of bilateral MCA branches. Moderate to advanced multifocal narrowing in the right P2 and P3 segments. 4 mm saccular outpouching superiorly from the left ophthalmic segment ICA. IMPRESSION: Brain MRI: 1. No acute finding. 2. Superficial siderosis along the bilateral cerebral convexities and diffuse white matter disease, consider amyloid angiopathy in this patient history of dementia. No acute hemorrhage. Intracranial MRA: 1. Motion degraded study without acute finding. 2. Atherosclerosis with narrowings most notable in the proximal right PCA. 3. 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 Electronically Signed   By: Marnee Spring M.D.   On: 08/18/2016 12:57  US Carotid Bilateral (at [facility])  Result Date: 08/18/2016 CLINICAL DATA:  TIA. History of CAD, hypertension and hyperlipidemia. History dementia. Former smoker. EXAM: BILATERAL CAROTID DUPLEX ULTRASOUND TECHNIQUE: Wallace Cullens scale imaging, color Doppler and duplex ultrasound were performed of bilateral carotid and vertebral arteries in the neck. COMPARISON:  None. FINDINGS: Criteria: Quantification of carotid stenosis is based on velocity parameters that correlate the residual internal carotid diameter with NASCET-based stenosis  levels, using the diameter of the distal internal carotid lumen as the denominator for stenosis measurement. The following velocity measurements were obtained: RIGHT ICA:  143/23 cm/sec CCA:  62/10 cm/sec SYSTOLIC ICA/CCA RATIO:  2.3 DIASTOLIC ICA/CCA RATIO:  2.4 ECA:  205 cm/sec LEFT ICA:  234/36 cm/sec CCA:  72/14 cm/sec SYSTOLIC ICA/CCA RATIO:  3.3 DIASTOLIC ICA/CCA RATIO:  2.6 ECA:  261 cm/sec RIGHT CAROTID ARTERY: There is a minimal amount of eccentric mixed echogenic plaque within the mid and distal aspects of the right common carotid artery (images 10 and 11). There is a moderate amount of eccentric mixed echogenic shadowing plaque within the right carotid bulb (image 16), extending to involve the origin and proximal aspects of the right internal carotid artery (image 24), resulting in elevated peak systolic velocities within the proximal right internal carotid artery (greatest acquired peak systolic velocity within the proximal right ICA measures 143 cm/sec - image 26). RIGHT VERTEBRAL ARTERY:  Antegrade Flow LEFT CAROTID ARTERY: The left common carotid artery is noted to be tortuous (image 37). There is a moderate amount of focal E centric mixed echogenic plaque within the distal aspect the left common carotid artery (image 45). There is a moderate to large amount of eccentric mixed echogenic plaque within the left carotid bulb (images 48 and 50), extending to involve the origin and proximal aspect the left internal carotid artery (image 58), which results in elevated peak systolic velocities within the mid and distal aspects of the left internal carotid artery (greatest acquired peak systolic velocity within distal left ICA measures 234 cm/sec - image 66). LEFT VERTEBRAL ARTERY:  Antegrade Flow IMPRESSION: 1. Moderate to large amount of atherosclerotic plaque, left greater than right, results in elevated peak systolic velocities bilaterally, the left compatible with the greater than 70% luminal narrowing  range, and the right compatible with the 50 to 69% luminal narrowing range. Further evaluation with CTA could performed as clinically indicated. 2. Antegrade flow demonstrated within the bilateral vertebral arteries. Electronically Signed   By: Simonne Come M.D.   On: 08/18/2016 12:49   Mr Maxine Glenn Head/brain ZO Cm  Result Date: 08/18/2016 CLINICAL DATA:  Altered mental status.  TIA EXAM: MRI HEAD WITHOUT CONTRAST MRA HEAD WITHOUT CONTRAST TECHNIQUE: Multiplanar, multiecho pulse sequences of the brain and surrounding structures were obtained without intravenous contrast. Angiographic images of the head were obtained using MRA technique without contrast. COMPARISON:  Head CT from yesterday FINDINGS: MRI HEAD FINDINGS Brain: No acute infarction, hemorrhage, hydrocephalus, extra-axial collection or mass lesion. Confluent areas of superficial siderosis along the right frontal pole, right occipital parietal convexity, and posterior left temporal lobe. No acute hemorrhage noted on previous head CT. Extensive FLAIR hyperintensity in the cerebral white matter, likely microvascular ischemic. Patient has history of dementia. Volume loss is overall mild for age. Vascular: Arterial findings below. Normal dural venous sinus flow voids. Skull and upper cervical spine: Negative for marrow lesion Sinuses/Orbits: Right cataract resection.  No acute finding. MRA HEAD FINDINGS Artifact from intermittent motion degradation. No major branch occlusion noted. Intracranial  atherosclerosis with atheromatous type irregularity of bilateral MCA branches. Moderate to advanced multifocal narrowing in the right P2 and P3 segments. 4 mm saccular outpouching superiorly from the left ophthalmic segment ICA. IMPRESSION: Brain MRI: 1. No acute finding. 2. Superficial siderosis along the bilateral cerebral convexities and diffuse white matter disease, consider amyloid angiopathy in this patient history of dementia. No acute hemorrhage. Intracranial MRA:  1. Motion degraded study without acute finding. 2. Atherosclerosis with narrowings most notable in the proximal right PCA. 3. 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 Electronically Signed   By: Marnee SpringJonathon  Watts M.D.   On: 08/18/2016 12:57    Microbiology: No results found for this or any previous visit (from the past 240 hour(s)).   Labs: Basic Metabolic Panel:  Recent Labs Lab 08/17/16 1637  NA 141  K 4.1  CL 102  CO2 32  GLUCOSE 96  BUN 18  CREATININE 1.06*  CALCIUM 9.3   Liver Function Tests:  Recent Labs Lab 08/17/16 1637  AST 22  ALT 12*  ALKPHOS 62  BILITOT 0.5  PROT 6.5  ALBUMIN 3.7   No results for input(s): LIPASE, AMYLASE in the last 168 hours. No results for input(s): AMMONIA in the last 168 hours. CBC:  Recent Labs Lab 08/17/16 1637  WBC 6.2  NEUTROABS 3.8  HGB 11.6*  HCT 36.0  MCV 96.0  PLT 163   Cardiac Enzymes:  Recent Labs Lab 08/17/16 1637  TROPONINI <0.03   BNP: BNP (last 3 results) No results for input(s): BNP in the last 8760 hours.  ProBNP (last 3 results) No results for input(s): PROBNP in the last 8760 hours.  CBG: No results for input(s): GLUCAP in the last 168 hours.     SignedChaya Jan:  HERNANDEZ ACOSTA,ESTELA  [facility] Pager: [phone] 08/18/2016, 3:55 PM

## 2016-08-18 NOTE — Evaluation (Signed)
Clinical/Bedside Swallow Evaluation Patient Details  Name: Jessica Medina MRN: [id number] Date of Birth: 27-Nov-1929  Today's Date: 08/18/2016 Time: SLP Start Time (ACUTE ONLY): 1550 SLP Stop Time (ACUTE ONLY): 1630 SLP Time Calculation (min) (ACUTE ONLY): 40 min  Past Medical History:  Past Medical History:  Diagnosis Date  . Anxiety   . Arthritis   . Constipation   . Dementia   . Depression   . Heart murmur   . Hypertension   . Spinal stenosis   . Urinary tract infection    Past Surgical History: History reviewed. No pertinent surgical history. HPI:  Pt is an 83 y.o. female with PMH of dementia and arthritis, brought to hospital with intermittent episodes of confusion. Patient does have dementia,but as per family she is having persistent word finding difficulty, diminished recognition of family members and disorientation. There is no history of slurred speech, no blurred vision. No weakness of extremities. She thinks she is in college at this time, not oriented to time,only oriented to herself. Head CT showed no acute findings. Bedside swallow eval ordered as pt with hx of dysphagia 2 years ago and therefore failed RN swallow screen. Cognitive linguistic eval also ordered as part of stroke workup.   Assessment / Plan / Recommendation Clinical Impression  Pt showed no overt s/s of aspiration. Pt was slow with consumption of ice cream and cracker; daughter in law stated this is baseline for her and that it does not make a difference the consistency of the food. CXR was limited but did not show any pulmonary consolidation or effusion. Aspiration risk is still increased due to cognitive status. Recommend initiating regular diet/ thin liquids, meds whole with liquid, intermittent supervision to cue small bites/ sips, ensure pt seated upright, encourage PO intake, may benefit from smaller, more frequent meals. Will sign off at this time; please re-consult if needs arise. SLP Visit Diagnosis:  Dysphagia, unspecified (R13.10)    Aspiration Risk  Mild aspiration risk    Diet Recommendation Regular;Thin liquid   Liquid Administration via: Cup;Straw Medication Administration: Whole meds with liquid Supervision: Intermittent supervision to cue for compensatory strategies Compensations: Minimize environmental distractions;Slow rate;Small sips/bites Postural Changes: Seated upright at 90 degrees    Other  Recommendations Oral Care Recommendations: Oral care BID   Follow up Recommendations 24 hour supervision/assistance      Frequency and Duration            Prognosis        Swallow Study   General HPI: Pt is an 83 y.o. female with PMH of dementia and arthritis, brought to hospital with intermittent episodes of confusion. Patient does have dementia,but as per family she is having persistent word finding difficulty, diminished recognition of family members and disorientation. There is no history of slurred speech, no blurred vision. No weakness of extremities. She thinks she is in college at this time, not oriented to time,only oriented to herself. Head CT showed no acute findings. Bedside swallow eval ordered as pt with hx of dysphagia 2 years ago and therefore failed RN swallow screen. Cognitive linguistic eval also ordered as part of stroke workup. Type of Study: Bedside Swallow Evaluation Diet Prior to this Study: Regular;Thin liquids Temperature Spikes Noted: No Respiratory Status: Room air History of Recent Intubation: No Behavior/Cognition: Alert;Cooperative;Pleasant mood;Requires cueing Oral Cavity Assessment: Within Functional Limits Oral Cavity - Dentition: Adequate natural dentition Vision: Functional for self-feeding Self-Feeding Abilities: Able to feed self Patient Positioning: Upright in bed Baseline Vocal Quality: Normal Volitional  Cough: Strong    Oral/Motor/Sensory Function Overall Oral Motor/Sensory Function: Within functional limits   Ice Chips Ice  chips: Not tested   Thin Liquid Thin Liquid: Within functional limits Presentation: Cup;Straw    Nectar Thick Nectar Thick Liquid: Not tested   Honey Thick Honey Thick Liquid: Not tested   Puree Puree: Not tested   Solid   GO   Solid: Within functional limits    Functional Assessment Tool Used:  (clinical judgment) Functional Limitations: Swallowing Swallow Current Status (N8295(G8996): At least 1 percent but less than 20 percent impaired, limited or restricted Swallow Goal Status [phone](G8997): At least 1 percent but less than 20 percent impaired, limited or restricted Swallow Discharge Status [phone](G8998): At least 1 percent but less than 20 percent impaired, limited or restricted   Jessica Gettel Cecille AverK Cyan Clippinger, MA, CCC-SLP 08/18/2016,4:40 PM

## 2016-08-18 NOTE — Progress Notes (Signed)
Nutrition Brief Note  Patient identified on the Malnutrition Screening Tool (MST) Report. Pt/Family had reported unintentional wt loss and poor intake due to decreased appetite  Wt Readings from Last 15 Encounters:  08/17/16 114 lb 6.7 oz (51.9 kg)   Only other wt in chart was from 2016, when she was ~120 lbs.   Body mass index is 20.27 kg/m. Patient meets criteria for healthy wt for ht based on current BMI.   Son in room. He states that PTA, the patient was eating well at home, did not have any trouble/chewing or swallowing and was maintaining weight. Pt has history of dementia, but is alert and oriented. Answers questions appropriately. In fact, when RD asked if she needed assistance, she stated, seemingly offended "I can feed myself Jessica BlaseBacon and eggs just like anyone else can"  She drinks Ensure at home. Will order here. Can add magic cup for variety.   Current diet order is NPO as has not been screened by SLP yet.   Ordered supplements in line with patients home regimen.   No nutrition interventions warranted at this time. If nutrition issues arise, please consult RD.   Christophe LouisNathan Miu Chiong RD, LDN, CNSC Clinical Nutrition Pager: [phone] 08/18/2016 2:02 PM

## 2016-08-18 NOTE — Clinical Social Work Note (Signed)
Clinical Social Work Assessment  Patient Details  Name: Warren Kugelman MRN: [id number] Date of Birth: 18-Oct-1929  Date of referral:  08/18/16               Reason for consult:  Facility Placement, [employer], Discharge Planning, Financial Concerns                Permission sought to share information with:  Tourist information centre manager, Customer service manager, Family Supports Permission granted to share information::  Yes, Verbal Permission Granted  Name::        Agency::  [facility]  Relationship::  Son Alvester Chou and daughter in Financial trader Information:     Housing/Transportation Living arrangements for the past 2 months:  [place] of Information:  Medical Team, Tourist information centre manager, Adult Children, Facility Patient Interpreter Needed:  None Criminal Activity/Legal Involvement Pertinent to Current Situation/Hospitalization:  No - Comment as needed Significant Relationships:  Adult Children, Other Family Members Lives with:  Spouse Do you feel safe going back to the place where you live?  No Need for family participation in patient care:  Yes (Comment)  Care giving concerns:  LCSW met with patient at the bedside and her family.  Family reports patient lives at home with her husband, but husband is very sick in [facility] and son does not know if he will survive admission.  Reports patient received help from private sitters and husband with ADLs, cooking, and cleaning. Patient uses a walker to walk, but at the current time she is not walking.  Family has voiced several concerns with patient's care and inability to care for patient in home as both daughter in law and son work and there is no other family.  Funds have been used to spend down on care for patient, but patient remains to too make too much (per report 2 years ago for medicaid).  Patient at this time is pleasantly confused, asking to go home wit husband.     Social Worker assessment / plan:   LCSW met with son and daughter in law. LCSW was able to explain insurance requirements and current status: observation.  LCSW explained recommendation for [facility] PT due to cognition and inability to progress.  Patient would meet criteria for long term care either in NH or memory care, however she lacks funding to pay for either at this time. Patient has applied for medicaid in the past and this was never completed, but family was told she would not qualify or she needed to divorce husband in effort to have less income. Family stated this was not an option.  Family reports they have spoken with [facility] and wanting her placed.  Call placed to [facility] in effort to understand conversation and information related to admission.  Alyse Low reports she will not be able to accept patient due to outliers such as no payor source, possibility of not being approved for medicaid due to land/home.  Alyse Low will follow up with family once application for medicaid is completed.  Plan:  Only option at this time is return home with sitters and family support. If family agreeable home health in which CM is aware. Family encouraged to apply for special assistance medicaid in effort to place patient in long term care and follow up with [facility] for placement next week.  Patient continues to work towards medical clearance and TIA work up.   Employment status:  Retired Forensic scientist:  Medicare PT Recommendations:  24  Hour Supervision, [place] / Referral to community resources:  [place]  Patient/Family's Response to care:  Emotional but understanding  Patient/Family's Understanding of and Emotional Response to Diagnosis, Current Treatment, and Prognosis:  Family very tearful feeling overwhelmed, stuck, and without options to care safely for patient. They are aware that her barrier remains insurance authorization and qualifying stay.  Emotional Assessment Appearance:   Appears stated age Attitude/Demeanor/Rapport:    Affect (typically observed):  Irritable, Restless Orientation:  Oriented to Self Alcohol / Substance use:  Not Applicable Psych involvement (Current and /or in the community):  No (Comment)  Discharge Needs  Concerns to be addressed:  Adjustment to Illness, Care Coordination, Financial / Insurance Concerns Readmission within the last 30 days:  No Current discharge risk:  None Barriers to Discharge:  Continued Medical Work up   [place], LCSW 08/18/2016, 2:56 PM

## 2016-08-18 NOTE — Progress Notes (Signed)
LCSW consulted for new SNF placement.  Patient currently not "inpatient status for SNF benefits and medicare coverage", thus would be private pay. Patient also does not have medicaid for a long term benefit.  If patient meets criteria for inpatient with a 3 midnight stay along with a PT consult qualifying a skilled rehab need.  Please re-consult if needs arise and change in admission status.  Jessica EmoryHannah Jazz Biddy LCSW, MSW Clinical Social Work: Optician, dispensingystem Wide Float Coverage for :  [phone]

## 2016-08-18 NOTE — Care Management Note (Signed)
Case Management Note  Patient Details  Name: Jessica Medina MRN: [id number] Date of Gardiner SleeperBirth: 11-25-1929  Subjective/Objective:    Adm from home for TIA workup. Patient from home with sitters and husband. Husband is currently in the hospital and will not be able to help care for patient. Family/patient refusing HH PT and wants to discuss options for SNF with CSW. Family aware that patient is OBS status.                 Action/Plan: Patient declines HH PT. CSW to talk with family.    Expected Discharge Date:       08/18/2016           Expected Discharge Plan:  Home/Self Care  In-House Referral:  Clinical Social Work  Discharge planning Services  CM Consult  Post Acute Care Choice:    Choice offered to:  Patient, Adult Children  DME Arranged:    DME Agency:     HH Arranged:    HH Agency:     Status of Service:  Completed, signed off  If discussed at [employer] Meetings, dates discussed:    Additional Comments:  Lynise Porr, Chrystine OilerSharley Diane, RN 08/18/2016, 2:12 PM

## 2016-08-18 NOTE — Progress Notes (Signed)
Pt refused continuous pulse ox on finger.

## 2016-08-18 NOTE — Progress Notes (Signed)
Pt being transported down to radiology via transport staff. IV fluids paused.

## 2016-08-18 NOTE — Progress Notes (Signed)
*  PRELIMINARY RESULTS* Echocardiogram 2D Echocardiogram has been performed.  Stacey DrainWhite, Ricki Vanhandel J 08/18/2016, 2:02 PM

## 2016-08-18 NOTE — Evaluation (Signed)
Speech Language Pathology Evaluation Patient Details Name: Jessica SleeperJudith Medina MRN: [id number] DOB: 1929-12-02 Today's Date: 08/18/2016 Time: 1550-1630 SLP Time Calculation (min) (ACUTE ONLY): 40 min  Problem List:  Patient Active Problem List   Diagnosis Date Noted  . Dementia 08/18/2016  . TIA (transient ischemic attack) 08/17/2016   Past Medical History:  Past Medical History:  Diagnosis Date  . Anxiety   . Arthritis   . Constipation   . Dementia   . Depression   . Heart murmur   . Hypertension   . Spinal stenosis   . Urinary tract infection    Past Surgical History: History reviewed. No pertinent surgical history. HPI:  Pt is an 83 y.o. female with PMH of dementia and arthritis, brought to hospital with intermittent episodes of confusion. Patient does have dementia,but as per family she is having persistent word finding difficulty, diminished recognition of family members and disorientation. There is no history of slurred speech, no blurred vision. No weakness of extremities. She thinks she is in college at this time, not oriented to time,only oriented to herself. Head CT showed no acute findings. Bedside swallow eval ordered as pt with hx of dysphagia 2 years ago and therefore failed RN swallow screen. Cognitive linguistic eval also ordered as part of stroke workup.   Assessment / Plan / Recommendation Clinical Impression  Pt with baseline dementia currently showing moderate to severe cognitive deficits including impairments in short-term memory, basic problem solving/ reasoning, and safety awareness. Expressive language deficits included occasional word finding difficulties at the conversation level and tangential speech. Daughter-in-law indicated that pt still seemed off of baseline with level of confusion; however, son felt pt was currently "better than she has been in days". Their primary concern is her frustration level/ not letting things go once upset. Discussed possibility  of short-term speech/language therapy services which would focus on compensatory strategies; however the family is not interested in this at this time. Reviewed strategies such as activities, redirection, avoiding disagreement when pt becomes frustrated. SLP will sign off; please re-consult if needs arise.     SLP Assessment  SLP Recommendation/Assessment: Patient does not need any further Speech Lanaguage Pathology Services SLP Visit Diagnosis: Dysphagia, unspecified (R13.10)    Follow Up Recommendations  24 hour supervision/assistance    Frequency and Duration           SLP Evaluation Cognition  Overall Cognitive Status: Impaired/Different from baseline Arousal/Alertness: Awake/alert Orientation Level: Oriented to person;Oriented to place;Disoriented to time;Disoriented to situation Attention: Selective Selective Attention: Appears intact Memory: Impaired Memory Impairment: Decreased recall of new information;Decreased short term memory Decreased Short Term Memory: Verbal basic;Functional basic Problem Solving: Impaired Problem Solving Impairment: Verbal basic;Functional basic Behaviors: Restless;Impulsive Safety/Judgment: Impaired       Comprehension  Auditory Comprehension Overall Auditory Comprehension: Appears within functional limits for tasks assessed Yes/No Questions: Within Functional Limits Commands: Impaired Two Step Basic Commands: 50-74% accurate    Expression Expression Primary Mode of Expression: Verbal Verbal Expression Overall Verbal Expression: Impaired Initiation: No impairment Level of Generative/Spontaneous Verbalization: Conversation Naming: Impairment Interfering Components: Premorbid deficit Non-Verbal Means of Communication: Not applicable   Oral / Motor  Oral Motor/Sensory Function Overall Oral Motor/Sensory Function: Within functional limits Motor Speech Overall Motor Speech: Appears within functional limits for tasks  assessed Articulation: Within functional limitis   GO          Functional Assessment Tool Used:  (clinical judgment) Functional Limitations: Memory Swallow Current Status (G2952(G8996): At least 1 percent but less  than 20 percent impaired, limited or restricted Swallow Goal Status [phone]): At least 1 percent but less than 20 percent impaired, limited or restricted Swallow Discharge Status [phone]): At least 1 percent but less than 20 percent impaired, limited or restricted Memory Current Status (U9811): At least 60 percent but less than 80 percent impaired, limited or restricted Memory Goal Status (B1478): At least 60 percent but less than 80 percent impaired, limited or restricted Memory Discharge Status [phone]): At least 60 percent but less than 80 percent impaired, limited or restricted         Metro Kung, MA, CCC-SLP 08/18/2016, 4:52 PM

## 2016-08-18 NOTE — Progress Notes (Signed)
IV removed, WNL. D/C instructions given to pt and family. Verbalized understanding. Pt family here to take home.

## 2016-08-18 NOTE — Progress Notes (Signed)
Notified Dr. Sharl Medina, pt is refusing vitals and refuses medication. Awaiting response.

## 2016-08-18 NOTE — Care Management (Signed)
PCP   PCP NOT IN SYSTEM  Demographics  Comment    Last edited by  on at   Address: Home Phone: Work Civil engineer, contractinghone: Mobile Phone:  [address]  [place] [id number]   [phone] --  SSN: Insurance: Marital Status: Religion:  [id number] MEDICARE Married None  Patient Ethnicity & Race   Ethnic Group Patient Race  Not Hispanic or Latino White or Caucasian  Documents Filed to Patient   Power of Attorney Living Will Clinical Unknown Study Attachment Consent Form ABN Waiver After Visit Summary Lab Result Scan Code Status MyChart Status Advance Care Planning  Not on File Not on File Not on File Not on File Not on File Not on File Filed Not on File DNR [Updated on 08/18/16 0028] Pending Jump to the Activity  Admission Information   Attending Provider Admitting Provider Admission Type Admission Date/Time  Philip AspenHernandez Acosta, Limmie PatriciaEstela Y, MD Meredeth IdeLama, Gagan S, MD Emergency 08/17/16 1615  Discharge Date Hospital Service Auth/Cert Status Service Area   Internal Medicine Incomplete  SERVICE AREA  Unit Room/Bed Admission Status   AP-DEPT 300 A317/A317-01 Admission (Confirmed)   Hospital Account   Name Acct ID Class Status Primary Coverage  Gardiner SleeperReynolds, [place] [id number] Observation Open MEDICARE - MEDICARE PART A AND B      Guarantor Account (for Hospital Account [id number])   Name Relation to Pt Service Area Active? Acct Type  Gardiner Sleepereynolds, Isadore Self CHSA Yes Personal/Family  Address Phone    [address] [place], [place] [id number] [phone])        Coverage Information (for Hospital Account [id number])   1. MEDICARE/MEDICARE PART A AND B   F/O Payor/Plan Precert #  MEDICARE/MEDICARE PART A AND B   Subscriber Subscriber #  Gardiner SleeperReynolds, Emmajane [id number]  Address Phone  [address] [place] [phone]   2. GENERIC COMMERCIAL/GENERIC COMMERCIAL   F/O Payor/Plan Precert #  [facility]GENERIC COMMERCIAL   Subscriber Subscriber #  Gardiner SleeperReynolds, Morrisa [id number]   Address Phone  [address] [place], [place] [phone]

## 2016-08-19 LAB — HEMOGLOBIN A1C
Hgb A1c MFr Bld: 4.8 % (ref 4.8–5.6)
Mean Plasma Glucose: 91 mg/dL

## 2017-12-10 IMAGING — MR MR MRA HEAD W/O CM
1 series · 13 of 48 positions shown · non-contrast
Comparison: Head CT from yesterday

CLINICAL DATA: Altered mental status.  TIA

EXAM:
MRI HEAD WITHOUT CONTRAST
MRA HEAD WITHOUT CONTRAST
TECHNIQUE: Multiplanar, multiecho pulse sequences of the brain and surrounding
structures were obtained without intravenous contrast. Angiographic
images of the head were obtained using MRA technique without
contrast.

[Series 3: MRA · axial · 0.7mm · 0.33mm/px · z∈[-40,+65]mm · 13 of 162 slices shown]
[im 1/162]
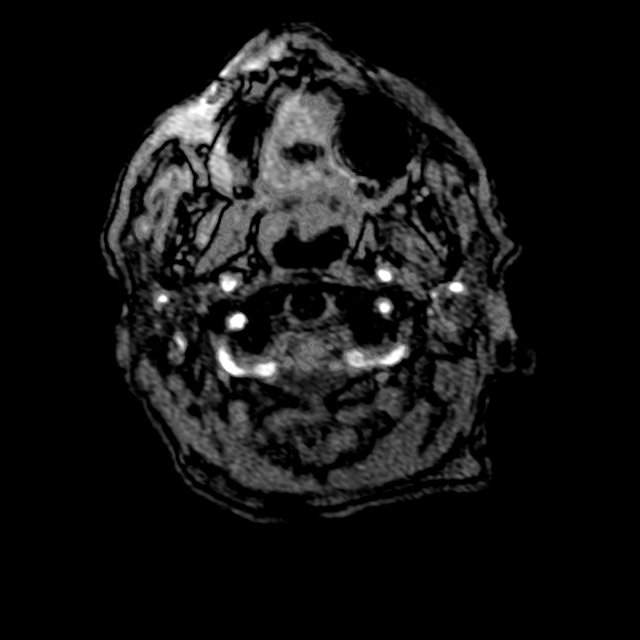
[im 4/162]
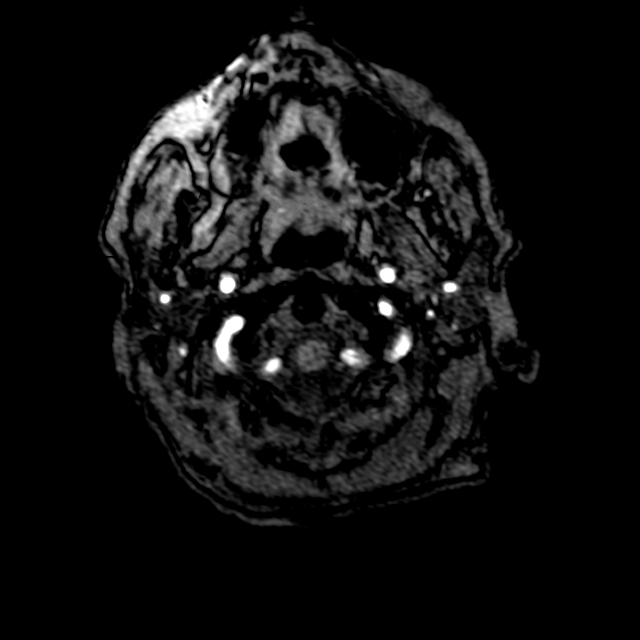
[im 11/162]
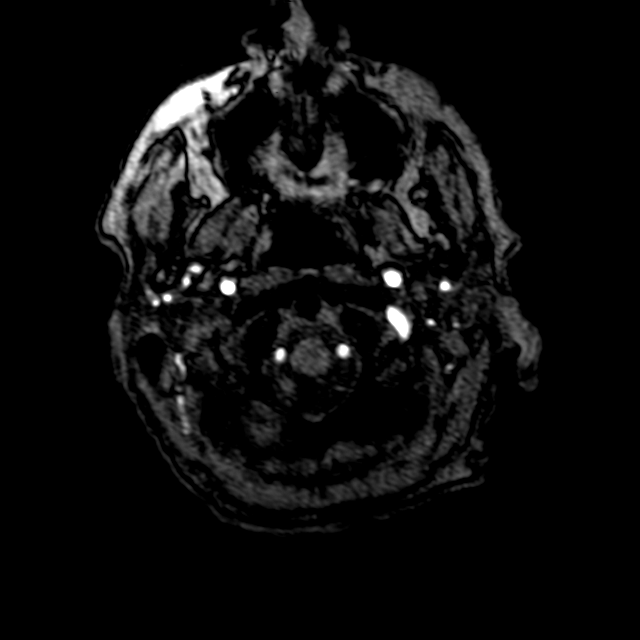
[im 28/162]
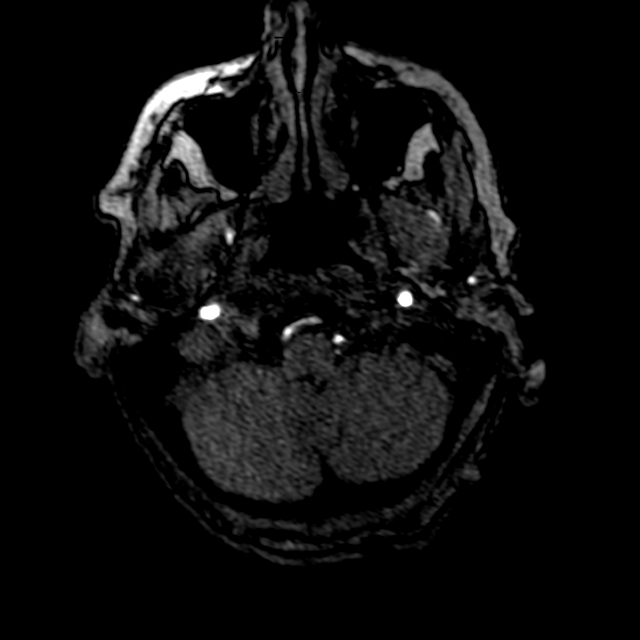
[im 31/162]
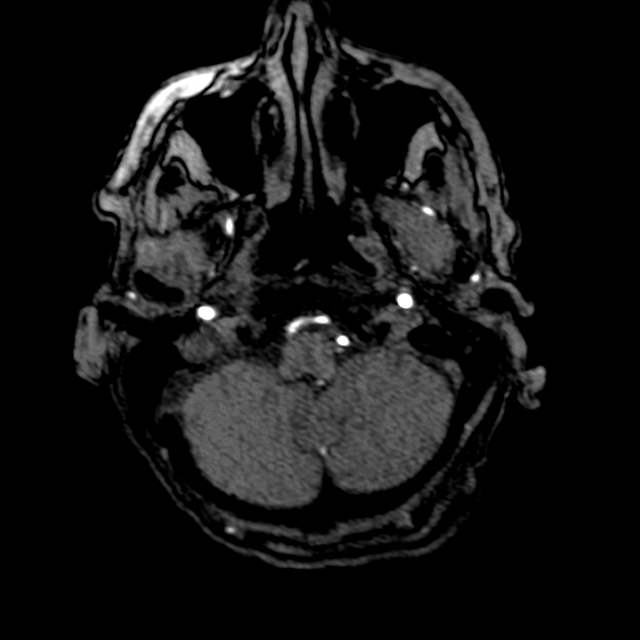
[im 52/162]
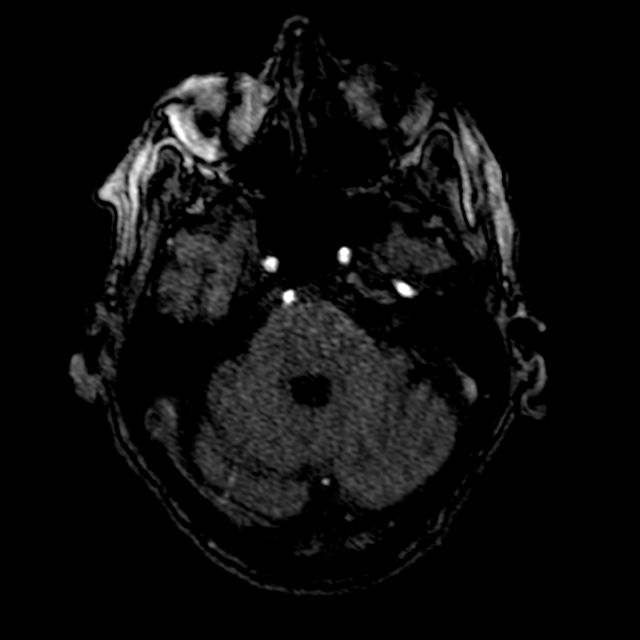
[im 72/162]
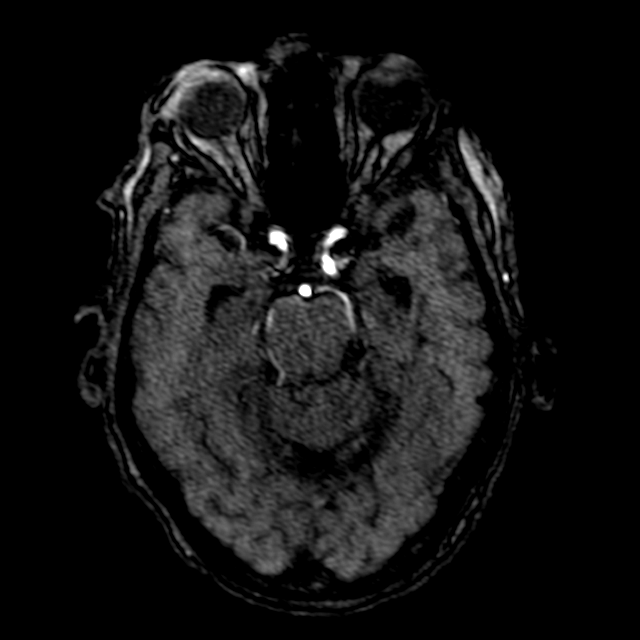
[im 83/162]
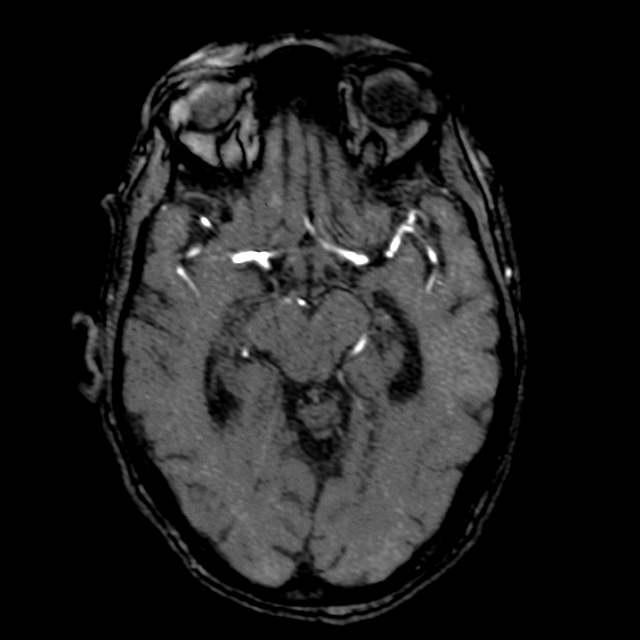
[im 93/162]
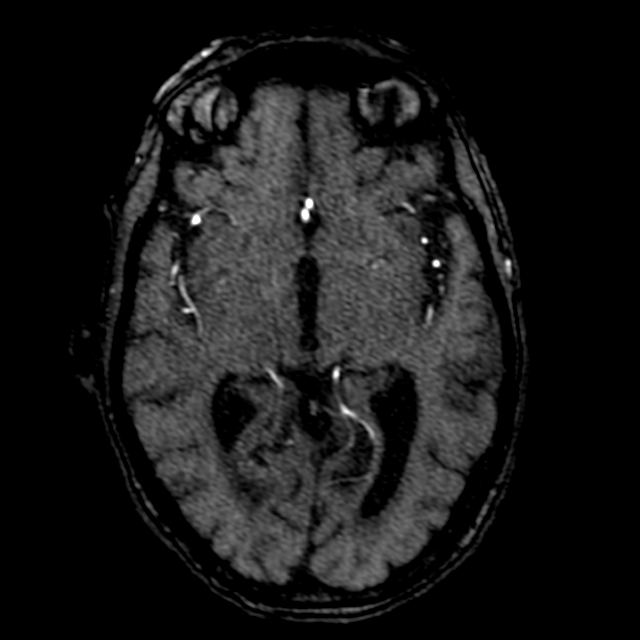
[im 114/162]
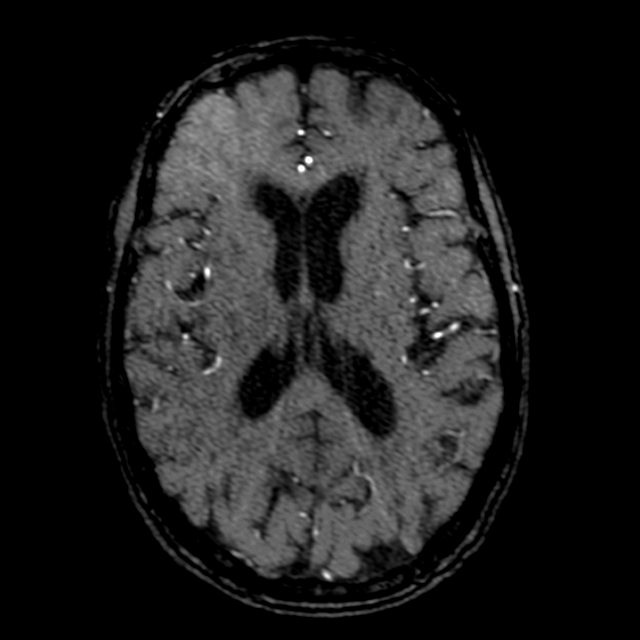
[im 134/162]
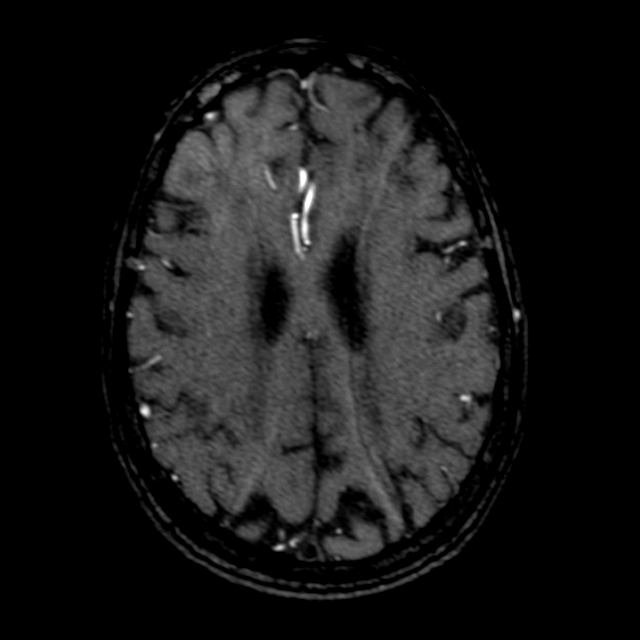
[im 138/162]
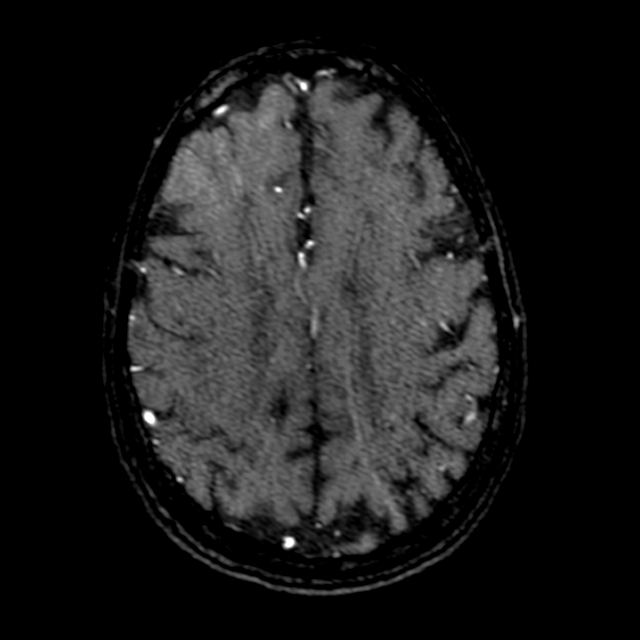
[im 155/162]
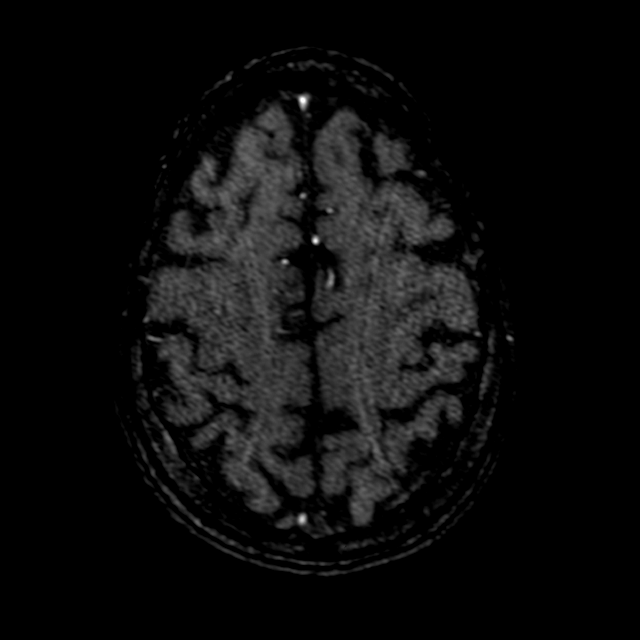

[13 of 48 positions shown; findings below may reference images not displayed]

FINDINGS: MRI HEAD FINDINGS

Brain: No acute infarction, hemorrhage, hydrocephalus, extra-axial
collection or mass lesion.

Confluent areas of superficial siderosis along the right frontal
pole, right occipital parietal convexity, and posterior left
temporal lobe. No acute hemorrhage noted on previous head CT.
Extensive FLAIR hyperintensity in the cerebral white matter, likely
microvascular ischemic.

Patient has history of dementia. Volume loss is overall mild for
age.

Vascular: Arterial findings below. Normal dural venous sinus flow
voids.

Skull and upper cervical spine: Negative for marrow lesion

Sinuses/Orbits: Right cataract resection.  No acute finding.

MRA HEAD FINDINGS

Artifact from intermittent motion degradation.

No major branch occlusion noted.

Intracranial atherosclerosis with atheromatous type irregularity of
bilateral MCA branches. Moderate to advanced multifocal narrowing in
the right P2 and P3 segments.

4 mm saccular outpouching superiorly from the left ophthalmic
segment ICA.
IMPRESSION: Brain MRI:

1. No acute finding.
2. Superficial siderosis along the bilateral cerebral convexities
and diffuse white matter disease, consider amyloid angiopathy in
this patient history of dementia. No acute hemorrhage.

Intracranial MRA:

1. Motion degraded study without acute finding.
2. Atherosclerosis with narrowings most notable in the proximal
right PCA.
3. 4 mm aneurysm of the left ophthalmic segment ICA.
